# Patient Record
Sex: Male | Born: 1975 | Race: White | Hispanic: No | Marital: Married | Smoking: Never smoker
Health system: Southern US, Community
[De-identification: ages and names within clinical notes are randomized; demographics above are authoritative.]

## PROBLEM LIST (undated history)

## (undated) DIAGNOSIS — E785 Hyperlipidemia, unspecified: Secondary | ICD-10-CM

## (undated) DIAGNOSIS — B019 Varicella without complication: Secondary | ICD-10-CM

## (undated) DIAGNOSIS — Z0000 Encounter for general adult medical examination without abnormal findings: Secondary | ICD-10-CM

## (undated) DIAGNOSIS — M1990 Unspecified osteoarthritis, unspecified site: Secondary | ICD-10-CM

## (undated) DIAGNOSIS — T7840XA Allergy, unspecified, initial encounter: Secondary | ICD-10-CM

## (undated) DIAGNOSIS — R030 Elevated blood-pressure reading, without diagnosis of hypertension: Secondary | ICD-10-CM

## (undated) HISTORY — DX: Hyperlipidemia, unspecified: E78.5

## (undated) HISTORY — PX: NO PAST SURGERIES: SHX2092

## (undated) HISTORY — DX: Unspecified osteoarthritis, unspecified site: M19.90

## (undated) HISTORY — DX: Encounter for general adult medical examination without abnormal findings: Z00.00

## (undated) HISTORY — DX: Allergy, unspecified, initial encounter: T78.40XA

## (undated) HISTORY — DX: Varicella without complication: B01.9

## (undated) HISTORY — DX: Elevated blood-pressure reading, without diagnosis of hypertension: R03.0

---

## 2011-06-13 ENCOUNTER — Ambulatory Visit (INDEPENDENT_AMBULATORY_CARE_PROVIDER_SITE_OTHER): Payer: Self-pay | Admitting: Family Medicine

## 2011-06-13 ENCOUNTER — Encounter: Payer: Self-pay | Admitting: Family Medicine

## 2011-06-13 DIAGNOSIS — E785 Hyperlipidemia, unspecified: Secondary | ICD-10-CM

## 2011-06-13 DIAGNOSIS — Z0000 Encounter for general adult medical examination without abnormal findings: Secondary | ICD-10-CM

## 2011-06-13 DIAGNOSIS — R030 Elevated blood-pressure reading, without diagnosis of hypertension: Secondary | ICD-10-CM

## 2011-06-13 LAB — CBC
Hemoglobin: 14.6 g/dL (ref 13.0–17.0)
MCHC: 34.3 g/dL (ref 30.0–36.0)
Platelets: 192 10*3/uL (ref 150.0–400.0)
RBC: 4.58 Mil/uL (ref 4.22–5.81)

## 2011-06-13 LAB — RENAL FUNCTION PANEL
Albumin: 4.6 g/dL (ref 3.5–5.2)
BUN: 14 mg/dL (ref 6–23)
CO2: 28 mEq/L (ref 19–32)
Calcium: 9.5 mg/dL (ref 8.4–10.5)
Chloride: 102 mEq/L (ref 96–112)
Phosphorus: 2.1 mg/dL — ABNORMAL LOW (ref 2.3–4.6)

## 2011-06-13 LAB — HEPATIC FUNCTION PANEL
ALT: 17 U/L (ref 0–53)
AST: 17 U/L (ref 0–37)
Albumin: 4.6 g/dL (ref 3.5–5.2)
Alkaline Phosphatase: 50 U/L (ref 39–117)
Total Protein: 7.1 g/dL (ref 6.0–8.3)

## 2011-06-13 LAB — TSH: TSH: 1.24 u[IU]/mL (ref 0.35–5.50)

## 2011-06-13 NOTE — Patient Instructions (Signed)
Preventive Care for Adults, Male A healthy lifestyle and preventative care can promote health and wellness. Preventative health guidelines for men include the following key practices:  A routine yearly physical is a good way to check with your caregiver about your health and preventative screening. It is a chance to share any concerns and updates on your health, and to receive a thorough exam.   Visit your dentist for a routine exam and preventative care every 6 months. Brush your teeth twice a day and floss once a day. Good oral hygiene prevents tooth decay and gum disease.   The frequency of eye exams is based on your age, health, family medical history, use of contact lenses, and other factors. Follow your caregiver's recommendations for frequency of eye exams.   Eat a healthy diet. Foods like vegetables, fruits, whole grains, low-fat dairy products, and lean protein foods contain the nutrients you need without too many calories. Decrease your intake of foods high in solid fats, added sugars, and salt. Eat the right amount of calories for you.Get information about a proper diet from your caregiver, if necessary.   Regular physical exercise is one of the most important things you can do for your health. Most adults should get at least 150 minutes of moderate-intensity exercise (any activity that increases your heart rate and causes you to sweat) each week. In addition, most adults need muscle-strengthening exercises on 2 or more days a week.   Maintain a healthy weight. The body mass index (BMI) is a screening tool to identify possible weight problems. It provides an estimate of body fat based on height and weight. Your caregiver can help determine your BMI, and can help you achieve or maintain a healthy weight.For adults 20 years and older:   A BMI below 18.5 is considered underweight.   A BMI of 18.5 to 24.9 is normal.   A BMI of 25 to 29.9 is considered overweight.   A BMI of 30 and above  is considered obese.   Maintain normal blood lipids and cholesterol levels by exercising and minimizing your intake of saturated fat. Eat a balanced diet with plenty of fruit and vegetables. Blood tests for lipids and cholesterol should begin at age 20 and be repeated every 5 years. If your lipid or cholesterol levels are high, you are over 50, or you are a high risk for heart disease, you may need your cholesterol levels checked more frequently.Ongoing high lipid and cholesterol levels should be treated with medicines if diet and exercise are not effective.   If you smoke, find out from your caregiver how to quit. If you do not use tobacco, do not start.   If you choose to drink alcohol, do not exceed 2 drinks per day. One drink is considered to be 12 ounces (355 mL) of beer, 5 ounces (148 mL) of wine, or 1.5 ounces (44 mL) of liquor.   Avoid use of street drugs. Do not share needles with anyone. Ask for help if you need support or instructions about stopping the use of drugs.   High blood pressure causes heart disease and increases the risk of stroke. Your blood pressure should be checked at least every 1 to 2 years. Ongoing high blood pressure should be treated with medicines, if weight loss and exercise are not effective.   If you are 45 to 38 years old, ask your caregiver if you should take aspirin to prevent heart disease.   Diabetes screening involves taking a blood   sample to check your fasting blood sugar level. This should be done once every 3 years, after age 45, if you are within normal weight and without risk factors for diabetes. Testing should be considered at a younger age or be carried out more frequently if you are overweight and have at least 1 risk factor for diabetes.   Colorectal cancer can be detected and often prevented. Most routine colorectal cancer screening begins at the age of 50 and continues through age 75. However, your caregiver may recommend screening at an earlier  age if you have risk factors for colon cancer. On a yearly basis, your caregiver may provide home test kits to check for hidden blood in the stool. Use of a small camera at the end of a tube, to directly examine the colon (sigmoidoscopy or colonoscopy), can detect the earliest forms of colorectal cancer. Talk to your caregiver about this at age 50, when routine screening begins. Direct examination of the colon should be repeated every 5 to 10 years through age 75, unless early forms of pre-cancerous polyps or small growths are found.   Hepatitis C blood testing is recommended for all people born from 1945 through 1965 and any individual with known risks for hepatitis C.   Practice safe sex. Use condoms and avoid high-risk sexual practices to reduce the spread of sexually transmitted infections (STIs). STIs include gonorrhea, chlamydia, syphilis, trichomonas, herpes, HPV, and human immunodeficiency virus (HIV). Herpes, HIV, and HPV are viral illnesses that have no cure. They can result in disability, cancer, and death.   A one-time screening for abdominal aortic aneurysm (AAA) and surgical repair of large AAAs by sound wave imaging (ultrasonography) is recommended for ages 65 to 75 years who are current or former smokers.   Healthy men should no longer receive prostate-specific antigen (PSA) blood tests as part of routine cancer screening. Consult with your caregiver about prostate cancer screening.   Testicular cancer screening is not recommended for adult males who have no symptoms. Screening includes self-exam, caregiver exam, and other screening tests. Consult with your caregiver about any symptoms you have or any concerns you have about testicular cancer.   Use sunscreen with skin protection factor (SPF) of 30 or more. Apply sunscreen liberally and repeatedly throughout the day. You should seek shade when your shadow is shorter than you. Protect yourself by wearing long sleeves, pants, a  wide-brimmed hat, and sunglasses year round, whenever you are outdoors.   Once a month, do a whole body skin exam, using a mirror to look at the skin on your back. Notify your caregiver of new moles, moles that have irregular borders, moles that are larger than a pencil eraser, or moles that have changed in shape or color.   Stay current with required immunizations.   Influenza. You need a dose every fall (or winter). The composition of the flu vaccine changes each year, so being vaccinated once is not enough.   Pneumococcal polysaccharide. You need 1 to 2 doses if you smoke cigarettes or if you have certain chronic medical conditions. You need 1 dose at age 65 (or older) if you have never been vaccinated.   Tetanus, diphtheria, pertussis (Tdap, Td). Get 1 dose of Tdap vaccine if you are younger than age 65 years, are over 65 and have contact with an infant, are a healthcare worker, or simply want to be protected from whooping cough. After that, you need a Td booster dose every 10 years. Consult your caregiver if   you have not had at least 3 tetanus and diphtheria-containing shots sometime in your life or have a deep or dirty wound.   HPV. This vaccine is recommended for males 13 through 38 years of age. This vaccine may be given to men 22 through 38 years of age who have not completed the 3 dose series. It is recommended for men through age 26 who have sex with men or whose immune system is weakened because of HIV infection, other illness, or medications. The vaccine is given in 3 doses over 6 months.   Measles, mumps, rubella (MMR). You need at least 1 dose of MMR if you were born in 1957 or later. You may also need a 2nd dose.   Meningococcal. If you are age 19 to 21 years and a first-year college student living in a residence hall, or have one of several medical conditions, you need to get vaccinated against meningococcal disease. You may also need additional booster doses.   Zoster (shingles).  If you are age 60 years or older, you should get this vaccine.   Varicella (chickenpox). If you have never had chickenpox or you were vaccinated but received only 1 dose, talk to your caregiver to find out if you need this vaccine.   Hepatitis A. You need this vaccine if you have a specific risk factor for hepatitis A virus infection, or you simply wish to be protected from this disease. The vaccine is usually given as 2 doses, 6 to 18 months apart.   Hepatitis B. You need this vaccine if you have a specific risk factor for hepatitis B virus infection or you simply wish to be protected from this disease. The vaccine is given in 3 doses, usually over 6 months.  Preventative Service / Frequency Ages 19 to 39  Blood pressure check.** / Every 1 to 2 years.   Lipid and cholesterol check.** / Every 5 years beginning at age 20.   Hepatitis C blood test.** / For any individual with known risks for hepatitis C.   Skin self-exam. / Monthly.   Influenza immunization.** / Every year.   Pneumococcal polysaccharide immunization.** / 1 to 2 doses if you smoke cigarettes or if you have certain chronic medical conditions.   Tetanus, diphtheria, pertussis (Tdap,Td) immunization. / A one-time dose of Tdap vaccine. After that, you need a Td booster dose every 10 years.   HPV immunization. / 3 doses over 6 months, if 26 and younger.   Measles, mumps, rubella (MMR) immunization. / You need at least 1 dose of MMR if you were born in 1957 or later. You may also need a 2nd dose.   Meningococcal immunization. / 1 dose if you are age 19 to 21 years and a first-year college student living in a residence hall, or have one of several medical conditions, you need to get vaccinated against meningococcal disease. You may also need additional booster doses.   Varicella immunization.** / Consult your caregiver.   Hepatitis A immunization.** / Consult your caregiver. 2 doses, 6 to 18 months apart.   Hepatitis B  immunization.** / Consult your caregiver. 3 doses usually over 6 months.  Ages 40 to 64  Blood pressure check.** / Every 1 to 2 years.   Lipid and cholesterol check.** / Every 5 years beginning at age 20.   Fecal occult blood test (FOBT) of stool. / Every year beginning at age 50 and continuing until age 75. You may not have to do this test if   you get colonoscopy every 10 years.   Flexible sigmoidoscopy** or colonoscopy.** / Every 5 years for a flexible sigmoidoscopy or every 10 years for a colonoscopy beginning at age 50 and continuing until age 75.   Hepatitis C blood test.** / For all people born from 1945 through 1965 and any individual with known risks for hepatitis C.   Skin self-exam. / Monthly.   Influenza immunization.** / Every year.   Pneumococcal polysaccharide immunization.** / 1 to 2 doses if you smoke cigarettes or if you have certain chronic medical conditions.   Tetanus, diphtheria, pertussis (Tdap/Td) immunization.** / A one-time dose of Tdap vaccine. After that, you need a Td booster dose every 10 years.   Measles, mumps, rubella (MMR) immunization. / You need at least 1 dose of MMR if you were born in 1957 or later. You may also need a 2nd dose.   Varicella immunization.**/ Consult your caregiver.   Meningococcal immunization.** / Consult your caregiver.   Hepatitis A immunization.** / Consult your caregiver. 2 doses, 6 to 18 months apart.   Hepatitis B immunization.** / Consult your caregiver. 3 doses, usually over 6 months.  Ages 65 and over  Blood pressure check.** / Every 1 to 2 years.   Lipid and cholesterol check.**/ Every 5 years beginning at age 20.   Fecal occult blood test (FOBT) of stool. / Every year beginning at age 50 and continuing until age 75. You may not have to do this test if you get colonoscopy every 10 years.   Flexible sigmoidoscopy** or colonoscopy.** / Every 5 years for a flexible sigmoidoscopy or every 10 years for a colonoscopy  beginning at age 50 and continuing until age 75.   Hepatitis C blood test.** / For all people born from 1945 through 1965 and any individual with known risks for hepatitis C.   Abdominal aortic aneurysm (AAA) screening.** / A one-time screening for ages 65 to 75 years who are current or former smokers.   Skin self-exam. / Monthly.   Influenza immunization.** / Every year.   Pneumococcal polysaccharide immunization.** / 1 dose at age 65 (or older) if you have never been vaccinated.   Tetanus, diphtheria, pertussis (Tdap, Td) immunization. / A one-time dose of Tdap vaccine if you are over 65 and have contact with an infant, are a healthcare worker, or simply want to be protected from whooping cough. After that, you need a Td booster dose every 10 years.   Varicella immunization. ** / Consult your caregiver.   Meningococcal immunization.** / Consult your caregiver.   Hepatitis A immunization. ** / Consult your caregiver. 2 doses, 6 to 18 months apart.   Hepatitis B immunization.** / Check with your caregiver. 3 doses, usually over 6 months.  **Family history and personal history of risk and conditions may change your caregiver's recommendations. Document Released: 03/12/2001 Document Revised: 01/03/2011 Document Reviewed: 06/11/2010 [employer] Patient Information 2012 [employer]. 

## 2011-06-16 ENCOUNTER — Encounter: Payer: Self-pay | Admitting: Family Medicine

## 2011-06-16 DIAGNOSIS — IMO0001 Reserved for inherently not codable concepts without codable children: Secondary | ICD-10-CM

## 2011-06-16 DIAGNOSIS — E785 Hyperlipidemia, unspecified: Secondary | ICD-10-CM

## 2011-06-16 DIAGNOSIS — Z0000 Encounter for general adult medical examination without abnormal findings: Secondary | ICD-10-CM

## 2011-06-16 HISTORY — DX: Reserved for inherently not codable concepts without codable children: IMO0001

## 2011-06-16 HISTORY — DX: Hyperlipidemia, unspecified: E78.5

## 2011-06-16 HISTORY — DX: Encounter for general adult medical examination without abnormal findings: Z00.00

## 2011-06-16 NOTE — Assessment & Plan Note (Signed)
Does drink excessive amounts of caffeine, 4 cups and more a day, encouraged to minimize caffeine and sodium get exercise and eight hours sleep.

## 2011-06-16 NOTE — Assessment & Plan Note (Signed)
Encouraged exercise, heart healthy diet, adequate sleep and maintain a healthy weight

## 2011-06-16 NOTE — Assessment & Plan Note (Signed)
Mild elevation of ldl cholesterol, encouraged to avoid trans fats and start MegaRed capas daily

## 2011-06-16 NOTE — Progress Notes (Signed)
Patient ID: Jeffrey Golden. Jeffrey Golden, male   DOB: 1975/11/25, 36 y.o.   MRN: [id number] Jeffrey Golden. Reindl [id number] November 02, 1975 06/16/2011      Progress Note New Patient  Subjective  Chief Complaint  Chief Complaint  Patient presents with  . Establish Care    new patient    HPI  This is a 36 year old Caucasian male who is in today for new patient appointment. 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 He is generally in good health and is here today at the request of his wife made the appointment. He denies any recent illness, fevers, chills, chest pain, palpitations, headache, GI or GU complaints.  Past Medical History  Diagnosis Date  . Chicken pox as child  . Hyperlipidemia 06/16/2011  . Elevated BP 06/16/2011  . Preventative health care 06/16/2011    History reviewed. No pertinent past surgical history.  Family History  Problem Relation Age of Onset  . Cancer Maternal Grandmother     skin  . Parkinsonism Maternal Grandfather   . Cancer Maternal Grandfather     skin  . Cancer Paternal Grandfather     renal- remission  . Arthritis Paternal Grandmother     History   Social History  . Marital Status: Married    Spouse Name: N/A    Number of Children: N/A  . Years of Education: N/A   Occupational History  . Not on file.   Social History Main Topics  . Smoking status: Never Smoker   . Smokeless tobacco: Never Used  . Alcohol Use: Yes     1-2 beers a night 4-5 days a week  . Drug Use: No  . Sexually Active: Not on file   Other Topics Concern  . Not on file   Social History Narrative  . No narrative on file    No current outpatient prescriptions on file prior to visit.    No Known Allergies  Review of Systems  Review of Systems  Constitutional: Negative for fever, chills and malaise/fatigue.  HENT: Negative for hearing loss, nosebleeds and congestion.   Eyes: Negative for discharge.  Respiratory:  Negative for cough, sputum production, shortness of breath and wheezing.   Cardiovascular: Negative for chest pain, palpitations and leg swelling.  Gastrointestinal: Negative for heartburn, nausea, vomiting, abdominal pain, diarrhea, constipation and blood in stool.  Genitourinary: Negative for dysuria, urgency, frequency and hematuria.  Musculoskeletal: Negative for myalgias, back pain and falls.  Skin: Negative for rash.  Neurological: Negative for dizziness, tremors, sensory change, focal weakness, loss of consciousness, weakness and headaches.  Endo/Heme/Allergies: Negative for polydipsia. Does not bruise/bleed easily.  Psychiatric/Behavioral: Negative for depression and suicidal ideas. The patient is not nervous/anxious and does not have insomnia.     Objective  BP 131/92  Pulse 70  Temp(Src) 98.2 F (36.8 C) (Temporal)  Ht 5' 11.75" (1.822 m)  Wt 184 lb (83.462 kg)  BMI 25.13 kg/m2  SpO2 98%  Physical Exam  Physical Exam  Constitutional: He is oriented to person, place, and time and well-developed, well-nourished, and in no distress. No distress.  HENT:  Head: Normocephalic and atraumatic.  Eyes: Conjunctivae are normal.  Neck: Neck supple. No thyromegaly present.  Cardiovascular: Normal rate, regular rhythm and normal heart sounds.   No murmur heard. Pulmonary/Chest: Effort normal and breath sounds normal. No respiratory distress.  Abdominal: He exhibits no distension and no mass. There is no tenderness.  Musculoskeletal: He exhibits no  edema.  Neurological: He is alert and oriented to person, place, and time.  Skin: Skin is warm.  Psychiatric: Memory, affect and judgment normal.       Assessment & Plan  Elevated BP Does drink excessive amounts of caffeine, 4 cups and more a day, encouraged to minimize caffeine and sodium get exercise and eight hours sleep.   Hyperlipidemia Mild elevation of ldl cholesterol, encouraged to avoid trans fats and start MegaRed  capas daily  Preventative health care Encouraged exercise, heart healthy diet, adequate sleep and maintain a healthy weight

## 2012-03-14 ENCOUNTER — Other Ambulatory Visit: Payer: Self-pay

## 2012-12-03 ENCOUNTER — Other Ambulatory Visit: Payer: Self-pay

## 2013-10-25 ENCOUNTER — Other Ambulatory Visit (INDEPENDENT_AMBULATORY_CARE_PROVIDER_SITE_OTHER): Payer: Managed Care, Other (non HMO)

## 2013-10-25 ENCOUNTER — Encounter: Payer: Self-pay | Admitting: Family Medicine

## 2013-10-25 ENCOUNTER — Ambulatory Visit (INDEPENDENT_AMBULATORY_CARE_PROVIDER_SITE_OTHER): Payer: Managed Care, Other (non HMO) | Admitting: Family Medicine

## 2013-10-25 VITALS — BP 108/72 | HR 62 | Ht 72.0 in | Wt 165.0 lb

## 2013-10-25 DIAGNOSIS — M25519 Pain in unspecified shoulder: Secondary | ICD-10-CM

## 2013-10-25 DIAGNOSIS — M67919 Unspecified disorder of synovium and tendon, unspecified shoulder: Secondary | ICD-10-CM

## 2013-10-25 DIAGNOSIS — S46819A Strain of other muscles, fascia and tendons at shoulder and upper arm level, unspecified arm, initial encounter: Secondary | ICD-10-CM

## 2013-10-25 DIAGNOSIS — M25511 Pain in right shoulder: Secondary | ICD-10-CM

## 2013-10-25 DIAGNOSIS — S43431A Superior glenoid labrum lesion of right shoulder, initial encounter: Secondary | ICD-10-CM

## 2013-10-25 DIAGNOSIS — M7551 Bursitis of right shoulder: Secondary | ICD-10-CM

## 2013-10-25 DIAGNOSIS — M719 Bursopathy, unspecified: Secondary | ICD-10-CM

## 2013-10-25 DIAGNOSIS — S43439A Superior glenoid labrum lesion of unspecified shoulder, initial encounter: Secondary | ICD-10-CM | POA: Insufficient documentation

## 2013-10-25 DIAGNOSIS — S43499A Other sprain of unspecified shoulder joint, initial encounter: Secondary | ICD-10-CM

## 2013-10-25 MED ORDER — MELOXICAM 15 MG PO TABS: 15.0000 mg | ORAL_TABLET | Freq: Every day | ORAL | Status: DC

## 2013-10-25 NOTE — Progress Notes (Signed)
[facility] [place], [place] [address] Phone: [phone] Subjective:      CC: Right shoulder pain  [id number] Jeffrey Golden. Jeffrey Golden is a 38 y.o. male coming in with complaint of right shoulder pain. Patient states for the last month he started having increasing pain in the right shoulder. Patient does not remember any true injury but does play basketball regularly. Patient states that there has been pain when he does certain activities. Patient states reaching behind his back seems to be the most painful as well as above head. States it is just a sharp pain with certain movements and and seems to go away. Patient states that when he tries to lay on that side he has pain as well. Denies any weakness or any numbness in any radiation down the arm. Patient is a severity is 5/10. Describes it once again is a dull aching with a sharp pain from time to time.     Past medical history, social, surgical and family history all reviewed in electronic medical record.   Review of Systems: No headache, visual changes, nausea, vomiting, diarrhea, constipation, dizziness, abdominal pain, skin rash, fevers, chills, night sweats, weight loss, swollen lymph nodes, body aches, joint swelling, muscle aches, chest pain, shortness of breath, mood changes.   Objective Blood pressure 108/72, pulse 62, height 6' (1.829 m), weight 165 lb (74.844 kg), SpO2 98.00%.  General: No apparent distress alert and oriented x3 mood and affect normal, dressed appropriately.  HEENT: Pupils equal, extraocular movements intact  Respiratory: Patient's speak in full sentences and does not appear short of breath  Cardiovascular: No lower extremity edema, non tender, no erythema  Skin: Warm dry intact with no signs of infection or rash on extremities or on axial skeleton.  Abdomen: Soft nontender  Neuro: Cranial nerves II through XII are intact, neurovascularly intact in all extremities with  2+ DTRs and 2+ pulses.  Lymph: No lymphadenopathy of posterior or anterior cervical chain or axillae bilaterally.  Gait normal with good balance and coordination.  MSK:  Non tender with full range of motion and good stability and symmetric strength and tone of  elbows, wrist, hip, knee and ankles bilaterally.  Shoulder: Right Inspection reveals no abnormalities, atrophy or asymmetry. Palpation is normal with no tenderness over AC joint or bicipital groove. ROM is full in all planes passively. Rotator cuff strength normal throughout. signs of impingement with positive Neer and Hawkin's tests, but negative empty can sign. Speeds and Yergason's tests normal.  labral pathology noted with positive Obrien's, negative clunk and good stability. Normal scapular function observed. No painful arc and no drop arm sign. No apprehension sign Contralateral shoulder unremarkable  MSK US performed of: Right This study was ordered, performed, and interpreted by Charlann Boxer D.O.  Shoulder:   Supraspinatus:  Appears normal on long and transverse views, Bursal bulge seen with shoulder abduction on impingement view. Infraspinatus:  Appears normal on long and transverse views. Significant increase in Doppler flow Subscapularis:  Appears normal on long and transverse views. Positive bursa Teres Minor:  Appears normal on long and transverse views. AC joint:  Capsule undistended, no geyser sign. Glenohumeral Joint:  Appears normal without effusion. Glenoid Labrum:  Patient does have hypoechoic changes and what appears to be a tear within the posterior aspect of the labrum. Unable to see how far the day goes secondary to a depth. Biceps Tendon:  Appears normal on long and transverse views, no fraying of tendon,  tendon located in intertubercular groove, no subluxation with shoulder internal or external rotation.  Impression: Subacromial bursitis with posterior labral tear   Impression and Recommendations:      This case required medical decision making of moderate complexity.

## 2013-10-25 NOTE — Assessment & Plan Note (Signed)
Mild bursitis noted on ultrasound. We discussed icing cortical, home exercises, over-the-counter medications and was given a topical. Patient and will come back and see me again in 3-4 weeks for further evaluation.

## 2013-10-25 NOTE — Assessment & Plan Note (Signed)
Patient does have what appears to be a labral tear on ultrasound and on physical exam this is consistent. Patient to is able to do all activities of daily living. We discussed which activities to avoid. Patient is going to try oral as well as topical anti-inflammatories, icing protocol, as well as home exercises. Patient will try this and come back in 2-3 weeks. Patient does not make any significant improvement we'll either consider nitroglycerin patches versus potentially intra-articular injection.

## 2013-10-25 NOTE — Patient Instructions (Addendum)
Good to see you Ice 20 minutes 2 times daily. Usually after activity and before bed. Meloxicam daily for 10 days then as needed Pennsaid twice daily if you like it I will get you a prescription.  Exercises 3 times a week.  Come back in 2-3 weeks and if not better I get to  Stick you.  Turmeric 500mg  twice daily Vitamin D 2000 Iu dialy.

## 2015-07-20 ENCOUNTER — Ambulatory Visit (INDEPENDENT_AMBULATORY_CARE_PROVIDER_SITE_OTHER): Payer: Managed Care, Other (non HMO)

## 2015-07-20 ENCOUNTER — Ambulatory Visit (INDEPENDENT_AMBULATORY_CARE_PROVIDER_SITE_OTHER): Payer: Managed Care, Other (non HMO) | Admitting: Family Medicine

## 2015-07-20 VITALS — BP 110/72 | HR 68 | Temp 98.0°F | Resp 18 | Ht 72.5 in | Wt 166.0 lb

## 2015-07-20 DIAGNOSIS — S92302A Fracture of unspecified metatarsal bone(s), left foot, initial encounter for closed fracture: Secondary | ICD-10-CM

## 2015-07-20 DIAGNOSIS — S99922A Unspecified injury of left foot, initial encounter: Secondary | ICD-10-CM

## 2015-07-20 NOTE — Patient Instructions (Addendum)
Use crutches, do not bear weight  Wear short cam walker while awake. Remove several times a day and check circulation.    IF you received an x-ray today, you will receive an invoice from [facility]. Please contact [facility] at [phone] with questions or concerns regarding your invoice.   IF you received labwork today, you will receive an invoice from [employer]. Please contact [facility] at [phone] with questions or concerns regarding your invoice.   Our billing staff will not be able to assist you with questions regarding bills from these companies.  You will be contacted with the lab results as soon as they are available. The fastest way to get your results is to activate your My Chart account. Instructions are located on the last page of this paperwork. If you have not heard from [place] regarding the results in 2 weeks, please contact this office.    Metatarsal Fracture A metatarsal fracture is a break in a metatarsal bone. Metatarsal bones connect your toe bones to your ankle bones. CAUSES This type of fracture may be caused by:  A sudden twisting of your foot.  A fall onto your foot.  Overuse or repetitive exercise. RISK FACTORS This condition is more likely to develop in people who:  Play contact sports.  Have a bone disease.  Have a low calcium level. SYMPTOMS Symptoms of this condition include:  Pain that is worse when walking or standing.  Pain when pressing on the foot or moving the toes.  Swelling.  Bruising on the top or bottom of the foot.  A foot that appears shorter than the other one. DIAGNOSIS This condition is diagnosed with a physical exam. You may also have imaging tests, such as:  X-rays.  A CT scan.  MRI. TREATMENT Treatment for this condition depends on its severity and whether a bone has moved out of place. Treatment may involve:  Rest.  Wearing foot support such as a cast, splint, or  boot for several weeks.  Using crutches.  Surgery to move bones back into the right position. Surgery is usually needed if there are many pieces of broken bone or bones that are very out of place (displaced fracture).  Physical therapy. This may be needed to help you regain full movement and strength in your foot. You will need to return to your health care provider to have X-rays taken until your bones heal. Your health care provider will look at the X-rays to make sure that your foot is healing well. HOME CARE INSTRUCTIONS  If You Have a Cast:  Do not stick anything inside the cast to scratch your skin. Doing that increases your risk of infection.  Check the skin around the cast every day. Report any concerns to your health care provider. You may put lotion on dry skin around the edges of the cast. Do not apply lotion to the skin underneath the cast.  Keep the cast clean and dry. If You Have a Splint or a Supportive Boot:  Wear it as directed by your health care provider. Remove it only as directed by your health care provider.  Loosen it if your toes become numb and tingle, or if they turn cold and blue.  Keep it clean and dry. Bathing  Do not take baths, swim, or use a hot tub until your health care provider approves. Ask your health care provider if you can take showers. You may only be allowed to take sponge baths for bathing.  If your health care provider approves bathing and showering, cover the cast or splint with a watertight plastic bag to protect it from water. Do not let the cast or splint get wet. Managing Pain, Stiffness, and Swelling  If directed, apply ice to the injured area (if you have a splint, not a cast).  Put ice in a plastic bag.  Place a towel between your skin and the bag.  Leave the ice on for 20 minutes, 2-3 times per day.  Move your toes often to avoid stiffness and to lessen swelling.  Raise (elevate) the injured area above the level of your  heart while you are sitting or lying down. Driving  Do not drive or operate heavy machinery while taking pain medicine.  Do not drive while wearing foot support on a foot that you use for driving. Activity  Return to your normal activities as directed by your health care provider. Ask your health care provider what activities are safe for you.  Perform exercises as directed by your health care provider or physical therapist. Safety  Do not use the injured foot to support your body weight until your health care provider says that you can. Use crutches as directed by your health care provider. General Instructions  Do not put pressure on any part of the cast or splint until it is fully hardened. This may take several hours.  Do not use any tobacco products, including cigarettes, chewing tobacco, or e-cigarettes. Tobacco can delay bone healing. If you need help quitting, ask your health care provider.  Take medicines only as directed by your health care provider.  Keep all follow-up visits as directed by your health care provider. This is important. SEEK MEDICAL CARE IF:  You have a fever.  Your cast, splint, or boot is too loose or too tight.  Your cast, splint, or boot is damaged.  Your pain medicine is not helping.  You have pain, tingling, or numbness in your foot that is not going away. SEEK IMMEDIATE MEDICAL CARE IF:  You have severe pain.  You have tingling or numbness in your foot that is getting worse.  Your foot feels cold or becomes numb.  Your foot changes color.   This information is not intended to replace advice given to you by your health care provider. Make sure you discuss any questions you have with your health care provider.   Document Released: 10/06/2001 Document Revised: 05/31/2014 Document Reviewed: 11/10/2013 Elsevier Interactive Patient Education [employer].

## 2015-07-20 NOTE — Progress Notes (Signed)
   Subjective:    Patient ID: Jeffrey Golden. [name], male    DOB: 02-07-1975, 40 y.o.   MRN: [id number]  HPI This is a 40 yo male who presents today with left foot pain and swelling following rolling it while playing basketball. He applied ice and elevated it last night. Took 2 alleve last night and this morning.  Past Medical History  Diagnosis Date  . Chicken pox as child  . Hyperlipidemia 06/16/2011  . Elevated BP 06/16/2011  . Preventative health care 06/16/2011   History reviewed. No pertinent past surgical history. Family History  Problem Relation Age of Onset  . Cancer Maternal Grandmother     skin  . Parkinsonism Maternal Grandfather   . Cancer Maternal Grandfather     skin  . Cancer Paternal Grandfather     renal- remission  . Arthritis Paternal Grandmother    Social History  Substance Use Topics  . Smoking status: Never Smoker   . Smokeless tobacco: Never Used  . Alcohol Use: Yes     Comment: 1-2 beers a night 4-5 days a week      Review of Systems Per HPI     Objective:   Physical Exam  Constitutional: He is oriented to person, place, and time. He appears well-developed and well-nourished.  HENT:  Head: Normocephalic and atraumatic.  Eyes: Conjunctivae are normal.  Neck: Normal range of motion. Neck supple.  Cardiovascular: Normal rate.   Pulmonary/Chest: Effort normal.  Musculoskeletal:       Left foot: There is decreased range of motion, tenderness and swelling.       Feet:  Neurological: He is alert and oriented to person, place, and time.  Skin: Skin is warm and dry.  Psychiatric: He has a normal mood and affect. His behavior is normal. Judgment and thought content normal.  Vitals reviewed.     BP 110/72 mmHg  Pulse 68  Temp(Src) 98 F (36.7 C) (Oral)  Resp 18  Ht 6' 0.5" (1.842 m)  Wt 166 lb (75.297 kg)  BMI 22.19 kg/m2  SpO2 97% Wt Readings from Last 3 Encounters:  07/20/15 166 lb (75.297 kg)  10/25/13 165 lb (74.844 kg)  06/13/11 184  lb (83.462 kg)  Dg Foot Complete Left  07/20/2015  CLINICAL DATA:  Left foot injury playing basketball last night. EXAM: LEFT FOOT - COMPLETE 3+ VIEW COMPARISON:  None. FINDINGS: There is a displaced oblique fracture of the mid shaft of the fifth metatarsal. Remainder of the exam is within normal. IMPRESSION: Displaced oblique fracture of the midshaft of the fifth metatarsal. Electronically Signed   By: Marin Olp M.D.   On: 07/20/2015 17:50         Assessment & Plan:  1. Foot injury, left, initial encounter - DG Foot Complete Left; Future  2. Fracture of fifth metatarsal bone, left, closed, initial encounter - Patient advised of xray findings, placed in tall cam walker (no short cam walkers available in his size), instructed on use of crutches and to be non weight bearing until ortho evaluation - AMB referral to orthopedics   Clarene Reamer, FNP-BC  [facility]  07/24/2015 11:28 AM

## 2018-02-05 IMAGING — DX DG FOOT COMPLETE 3+V*L*
3 series · 3 of 3 positions shown · non-contrast
Comparison: None.

CLINICAL DATA: Left foot injury playing basketball last night.

EXAM:
LEFT FOOT - COMPLETE 3+ VIEW

[foot ap]
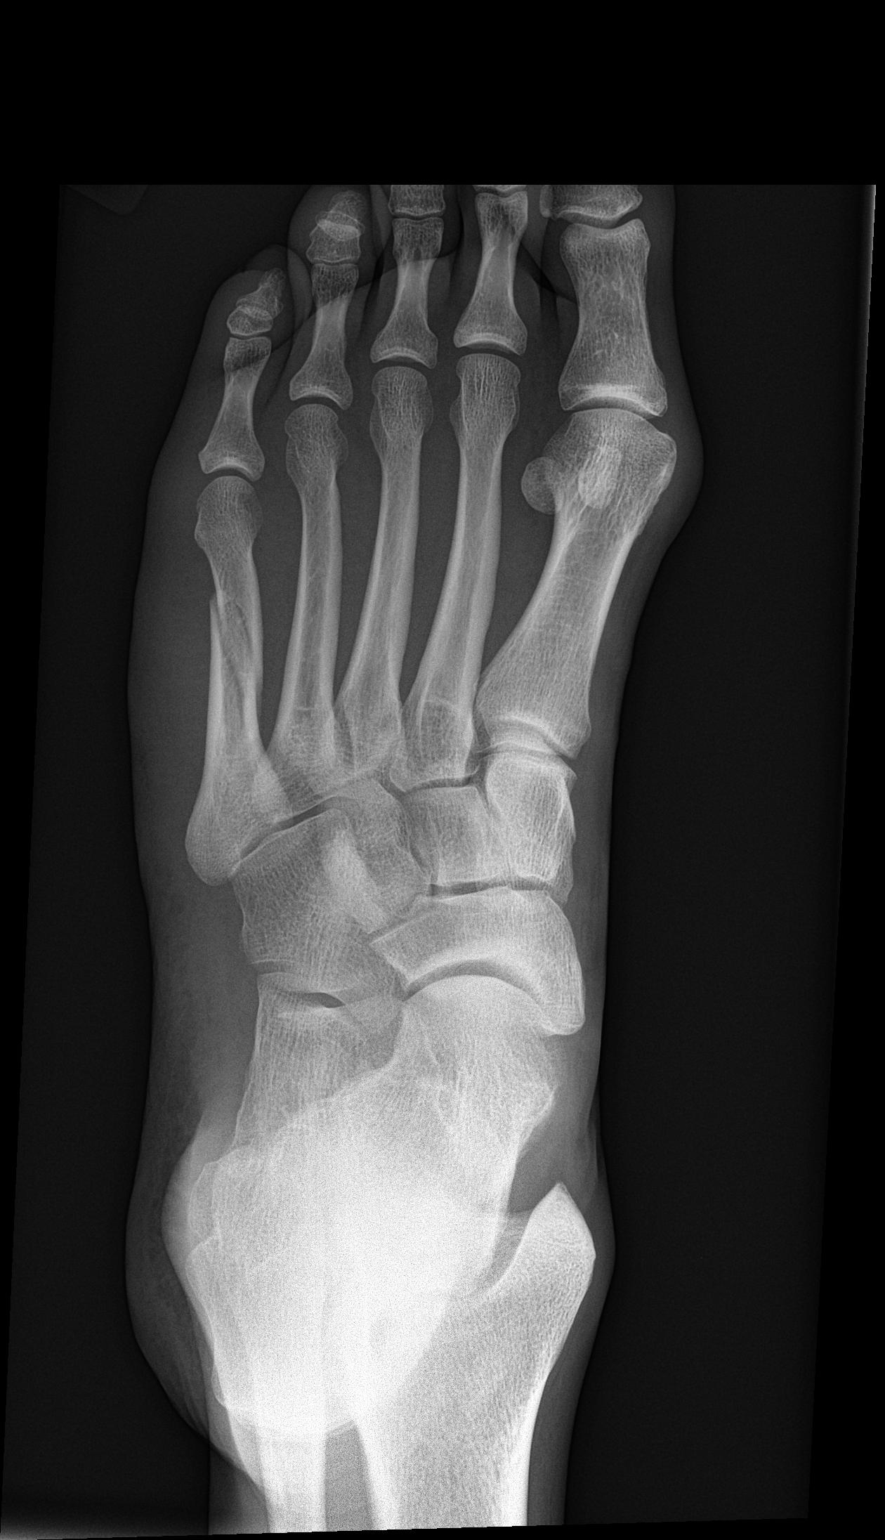

[foot obl]
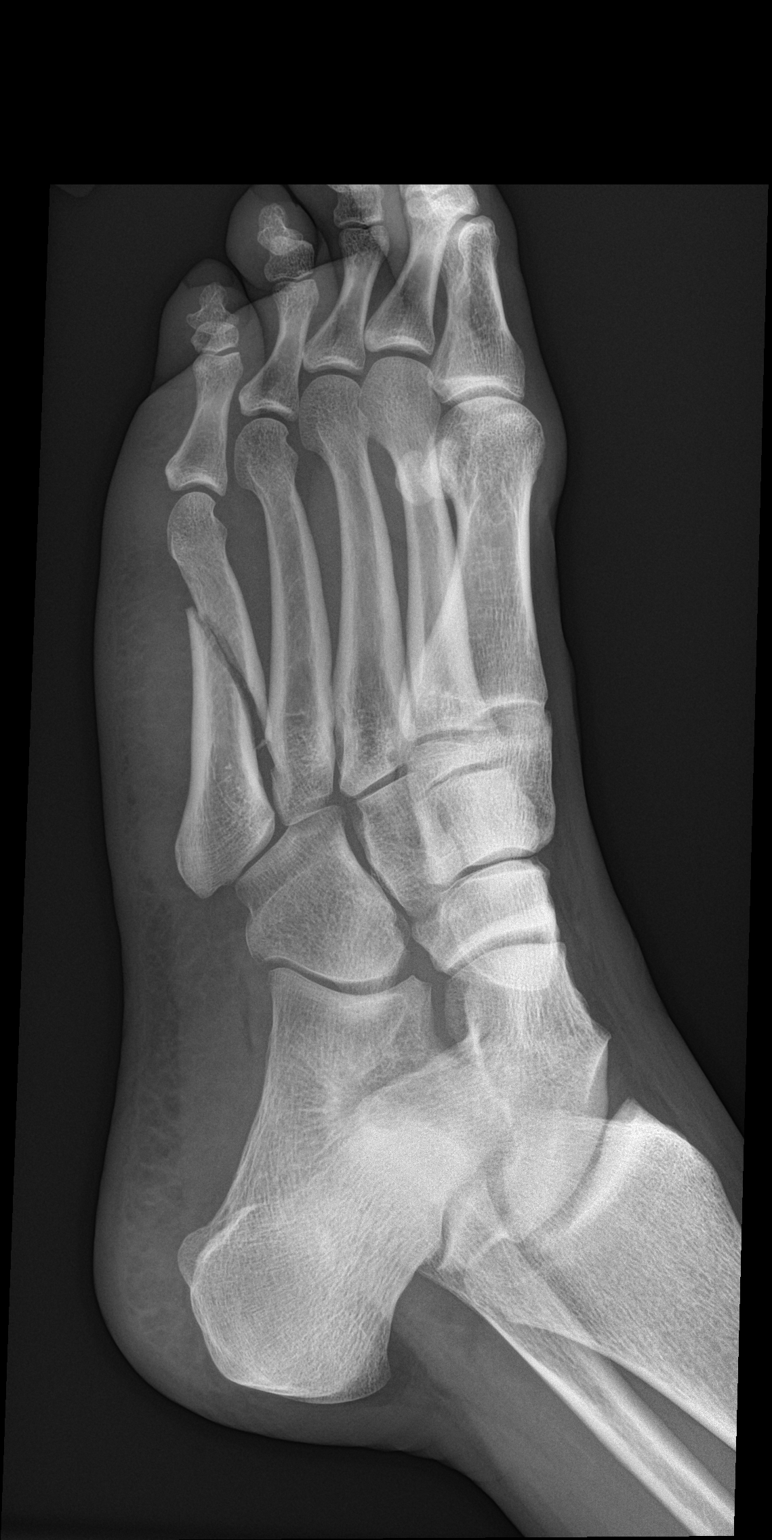

[foot lat]
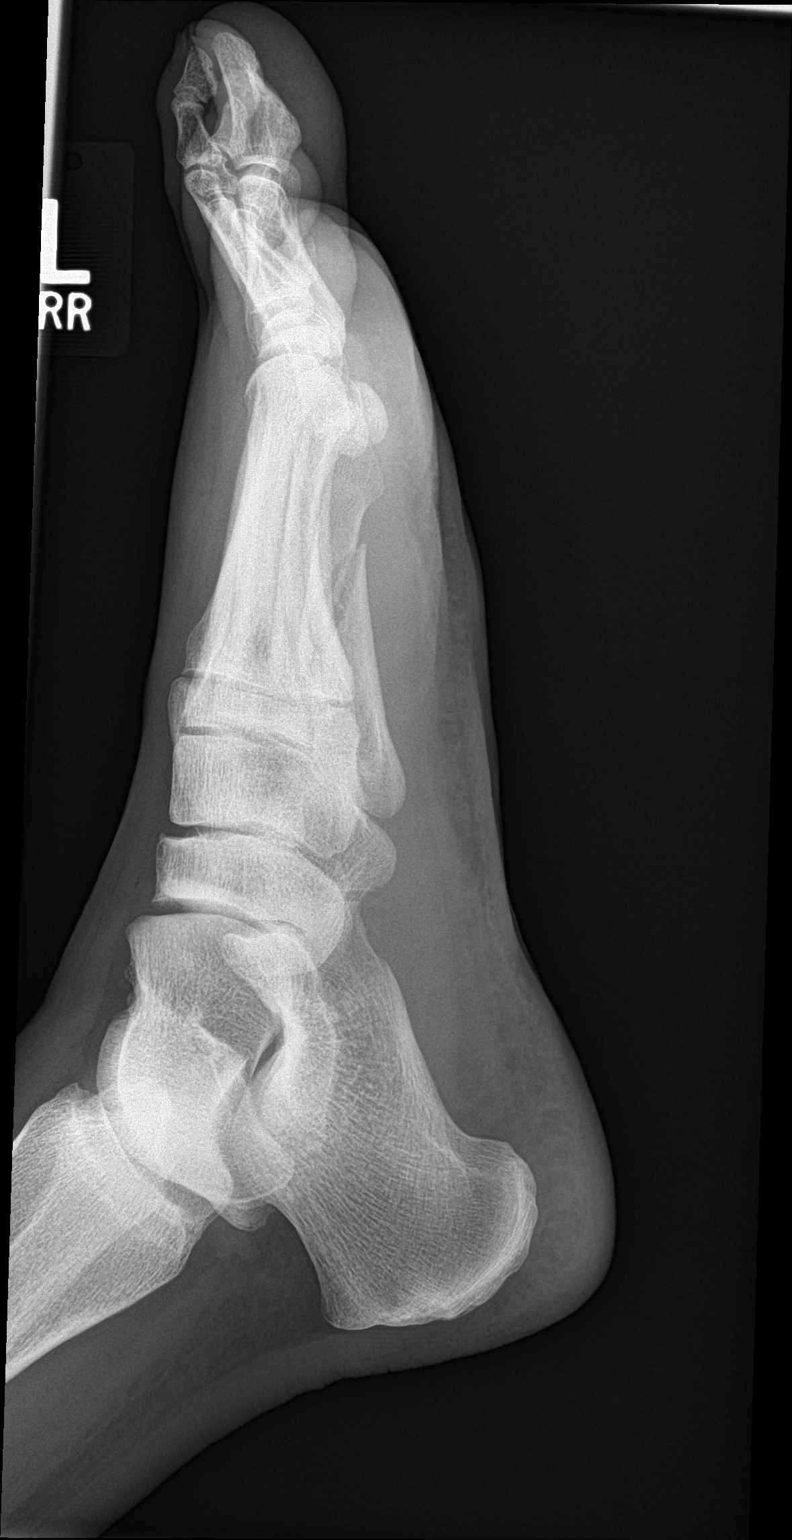

[3 of 3 positions shown; findings below may reference images not displayed]

FINDINGS: There is a displaced oblique fracture of the mid shaft of the fifth
metatarsal. Remainder of the exam is within normal.
IMPRESSION: Displaced oblique fracture of the midshaft of the fifth metatarsal.

## 2020-04-04 ENCOUNTER — Encounter: Payer: Self-pay | Admitting: Gastroenterology

## 2020-06-02 ENCOUNTER — Other Ambulatory Visit: Payer: Self-pay

## 2020-06-02 ENCOUNTER — Ambulatory Visit (AMBULATORY_SURGERY_CENTER): Payer: 59 | Admitting: *Deleted

## 2020-06-02 VITALS — Ht 72.5 in | Wt 175.0 lb

## 2020-06-02 DIAGNOSIS — Z1211 Encounter for screening for malignant neoplasm of colon: Secondary | ICD-10-CM

## 2020-06-02 MED ORDER — PEG 3350-KCL-NA BICARB-NACL 420 G PO SOLR: 4000.0000 mL | Freq: Once | ORAL | 0 refills | Status: AC

## 2020-06-02 NOTE — Progress Notes (Signed)

## 2020-06-13 ENCOUNTER — Encounter: Payer: Self-pay | Admitting: Gastroenterology

## 2020-06-16 ENCOUNTER — Other Ambulatory Visit: Payer: Self-pay

## 2020-06-16 ENCOUNTER — Encounter: Payer: Self-pay | Admitting: Gastroenterology

## 2020-06-16 ENCOUNTER — Ambulatory Visit (AMBULATORY_SURGERY_CENTER): Payer: 59 | Admitting: Gastroenterology

## 2020-06-16 VITALS — BP 100/68 | HR 56 | Temp 98.0°F | Resp 14 | Ht 72.5 in | Wt 175.0 lb

## 2020-06-16 DIAGNOSIS — D125 Benign neoplasm of sigmoid colon: Secondary | ICD-10-CM

## 2020-06-16 DIAGNOSIS — Z1211 Encounter for screening for malignant neoplasm of colon: Secondary | ICD-10-CM

## 2020-06-16 MED ORDER — SODIUM CHLORIDE 0.9 % IV SOLN: 500.0000 mL | Freq: Once | INTRAVENOUS | Status: DC

## 2020-06-16 NOTE — Progress Notes (Signed)
Pt's states no medical or surgical changes since previsit or office visit. 

## 2020-06-16 NOTE — Progress Notes (Signed)
No problems noted in the recovery room. maw 

## 2020-06-16 NOTE — Progress Notes (Signed)
Called to room to assist during endoscopic procedure.  Patient ID and intended procedure confirmed with present staff. Received instructions for my participation in the procedure from the performing physician.  

## 2020-06-16 NOTE — Progress Notes (Signed)
Report to PACU, RN, vss, BBS= Clear.  

## 2020-06-16 NOTE — Patient Instructions (Addendum)
Handouts were given to your care partner on polyps and hemorrhoids. You may resume your current medications today. Await biopsy results.  May take 1-3 weeks to receive pathology results. Please call if any questions or concerns.     YOU HAD AN ENDOSCOPIC PROCEDURE TODAY AT THE [facility]:   Refer to the procedure report that was given to you for any specific questions about what was found during the examination.  If the procedure report does not answer your questions, please call your gastroenterologist to clarify.  If you requested that your care partner not be given the details of your procedure findings, then the procedure report has been included in a sealed envelope for you to review at your convenience later.  YOU SHOULD EXPECT: Some feelings of bloating in the abdomen. Passage of more gas than usual.  Walking can help get rid of the air that was put into your GI tract during the procedure and reduce the bloating. If you had a lower endoscopy (such as a colonoscopy or flexible sigmoidoscopy) you may notice spotting of blood in your stool or on the toilet paper. If you underwent a bowel prep for your procedure, you may not have a normal bowel movement for a few days.  Please Note:  You might notice some irritation and congestion in your nose or some drainage.  This is from the oxygen used during your procedure.  There is no need for concern and it should clear up in a day or so.  SYMPTOMS TO REPORT IMMEDIATELY:   Following lower endoscopy (colonoscopy or flexible sigmoidoscopy):  Excessive amounts of blood in the stool  Significant tenderness or worsening of abdominal pains  Swelling of the abdomen that is new, acute  Fever of 100F or higher   For urgent or emergent issues, a gastroenterologist can be reached at any hour by calling [phone]. Do not use MyChart messaging for urgent concerns.    DIET:  We do recommend a small meal at first, but then you may proceed  to your regular diet.  Drink plenty of fluids but you should avoid alcoholic beverages for 24 hours.  ACTIVITY:  You should plan to take it easy for the rest of today and you should NOT DRIVE or use heavy machinery until tomorrow (because of the sedation medicines used during the test).    FOLLOW UP: Our staff will call the number listed on your records 48-72 hours following your procedure to check on you and address any questions or concerns that you may have regarding the information given to you following your procedure. If we do not reach you, we will leave a message.  We will attempt to reach you two times.  During this call, we will ask if you have developed any symptoms of COVID 19. If you develop any symptoms (ie: fever, flu-like symptoms, shortness of breath, cough etc.) before then, please call [phone].  If you test positive for Covid 19 in the 2 weeks post procedure, please call and report this information to us.    If any biopsies were taken you will be contacted by phone or by letter within the next 1-3 weeks.  Please call us at [phone] if you have not heard about the biopsies in 3 weeks.    SIGNATURES/CONFIDENTIALITY: You and/or your care partner have signed paperwork which will be entered into your electronic medical record.  These signatures attest to the fact that that the information above on your After Visit   Visit Summary has been reviewed and is understood.  Full responsibility of the confidentiality of this discharge information lies with you and/or your care-partner.

## 2020-06-16 NOTE — Op Note (Signed)
[facility] Patient Name: Jeffrey Golden Procedure Date: 06/16/2020 9:02 AM MRN: [id number] Endoscopist: Ladene Artist , MD Age: 45 Referring MD:  Date of Birth: 1975/10/04 Gender: Male Account #: [id number] Procedure:                Colonoscopy Indications:              Screening for colorectal malignant neoplasm Medicines:                Monitored Anesthesia Care Procedure:                Pre-Anesthesia Assessment:                           - Prior to the procedure, a History and Physical                            was performed, and patient medications and                            allergies were reviewed. The patient's tolerance of                            previous anesthesia was also reviewed. The risks                            and benefits of the procedure and the sedation                            options and risks were discussed with the patient.                            All questions were answered, and informed consent                            was obtained. Prior Anticoagulants: The patient has                            taken no previous anticoagulant or antiplatelet                            agents. ASA Grade Assessment: I - A normal, healthy                            patient. After reviewing the risks and benefits,                            the patient was deemed in satisfactory condition to                            undergo the procedure.                           After obtaining informed consent, the colonoscope  was passed under direct vision. Throughout the                            procedure, the patient's blood pressure, pulse, and                            oxygen saturations were monitored continuously. The                            Olympus CF-HQ190L (Serial# [id number]) Colonoscope was                            introduced through the anus and advanced to the the                            cecum, identified by  appendiceal orifice and                            ileocecal valve. The ileocecal valve, appendiceal                            orifice, and rectum were photographed. The quality                            of the bowel preparation was excellent. The                            colonoscopy was performed without difficulty. The                            patient tolerated the procedure well. Scope In: 9:09:07 AM Scope Out: 9:27:37 AM Scope Withdrawal Time: 0 hours 12 minutes 37 seconds  Total Procedure Duration: 0 hours 18 minutes 30 seconds  Findings:                 The perianal and digital rectal examinations were                            normal.                           A 7 mm polyp was found in the sigmoid colon. The                            polyp was sessile. The polyp was removed with a                            cold snare. Resection and retrieval were complete.                           Internal hemorrhoids were found during                            retroflexion. The hemorrhoids were small and Grade  I (internal hemorrhoids that do not prolapse).                           The exam was otherwise without abnormality on                            direct and retroflexion views. Complications:            No immediate complications. Estimated blood loss:                            None. Estimated Blood Loss:     Estimated blood loss: none. Impression:               - One 7 mm polyp in the sigmoid colon, removed with                            a cold snare. Resected and retrieved.                           - Internal hemorrhoids.                           - The examination was otherwise normal on direct                            and retroflexion views. Recommendation:           - Repeat colonoscopy after studies are complete for                            surveillance based on pathology results.                           - Patient has a contact number  available for                            emergencies. The signs and symptoms of potential                            delayed complications were discussed with the                            patient. Return to normal activities tomorrow.                            Written discharge instructions were provided to the                            patient.                           - Resume previous diet.                           - Continue present medications.                           -  Await pathology results. Ladene Artist, MD 06/16/2020 9:34:12 AM This report has been signed electronically.

## 2020-06-20 ENCOUNTER — Telehealth: Payer: Self-pay

## 2020-06-20 NOTE — Telephone Encounter (Signed)
  Follow up Call-  Call back number 06/16/2020  Post procedure Call Back phone  # [phone]  Permission to leave phone message Yes  Some recent data might be hidden     Patient questions:  Do you have a fever, pain , or abdominal swelling? No. Pain Score  0 *  Have you tolerated food without any problems? Yes.    Have you been able to return to your normal activities? Yes.    Do you have any questions about your discharge instructions: Diet   No. Medications  No. Follow up visit  No.  Do you have questions or concerns about your Care? No.  Actions: * If pain score is 4 or above: No action needed, pain <4.  1. Have you developed a fever since your procedure? no  2.   Have you had an respiratory symptoms (SOB or cough) since your procedure? no  3.   Have you tested positive for COVID 19 since your procedure no  4.   Have you had any family members/close contacts diagnosed with the COVID 19 since your procedure?  no   If yes to any of these questions please route to Joylene John, RN and Joella Prince, RN

## 2020-07-06 ENCOUNTER — Encounter: Payer: Self-pay | Admitting: Gastroenterology

## 2023-04-23 ENCOUNTER — Other Ambulatory Visit (HOSPITAL_BASED_OUTPATIENT_CLINIC_OR_DEPARTMENT_OTHER): Payer: Self-pay | Admitting: Student

## 2023-04-23 ENCOUNTER — Other Ambulatory Visit (HOSPITAL_BASED_OUTPATIENT_CLINIC_OR_DEPARTMENT_OTHER): Payer: Self-pay | Admitting: Family Medicine

## 2023-04-23 ENCOUNTER — Telehealth (HOSPITAL_BASED_OUTPATIENT_CLINIC_OR_DEPARTMENT_OTHER): Payer: Self-pay

## 2023-04-23 DIAGNOSIS — E785 Hyperlipidemia, unspecified: Secondary | ICD-10-CM

## 2023-05-12 ENCOUNTER — Ambulatory Visit (HOSPITAL_BASED_OUTPATIENT_CLINIC_OR_DEPARTMENT_OTHER)
Admission: RE | Admit: 2023-05-12 | Discharge: 2023-05-12 | Disposition: A | Discharge status: Home / Self Care | Payer: Self-pay | Source: Ambulatory Visit | Attending: Family Medicine | Admitting: Family Medicine

## 2023-05-12 DIAGNOSIS — E785 Hyperlipidemia, unspecified: Secondary | ICD-10-CM | POA: Insufficient documentation

## 2023-05-16 ENCOUNTER — Other Ambulatory Visit (HOSPITAL_COMMUNITY): Payer: Self-pay | Admitting: Family Medicine

## 2023-05-16 DIAGNOSIS — E785 Hyperlipidemia, unspecified: Secondary | ICD-10-CM

## 2023-05-21 ENCOUNTER — Ambulatory Visit: Admitting: Physician Assistant

## 2023-05-21 ENCOUNTER — Encounter: Payer: Self-pay | Admitting: Physician Assistant

## 2023-05-21 VITALS — BP 110/80 | HR 56 | Ht 71.0 in | Wt 171.1 lb

## 2023-05-21 DIAGNOSIS — K648 Other hemorrhoids: Secondary | ICD-10-CM | POA: Diagnosis not present

## 2023-05-21 DIAGNOSIS — E785 Hyperlipidemia, unspecified: Secondary | ICD-10-CM

## 2023-05-21 DIAGNOSIS — Z860101 Personal history of adenomatous and serrated colon polyps: Secondary | ICD-10-CM

## 2023-05-21 NOTE — Progress Notes (Signed)
 05/21/2023 Jeffrey Golden. [place] [id number] 17-Feb-1975  Referring provider: No ref. provider found Primary GI doctor: Dr. Venice Gillis ( Dr. Sandrea Cruel)  ASSESSMENT AND PLAN:   Colon polyp, wife concerned about colon cancer, here to discuss further 2022 7 mm precancerous polyp removed from sigmoid colon.  Family history of colon cancer in maternal grandmother. Screening interval set at 7 years based on polyp size and family history. Asymptomatic.  - Gather information on family history, specifically age of colon cancer diagnosis in maternal grandmother and details of mother's colonoscopy history. - Maintain current screening interval of 7 years unless new symptoms arise or family history indicates increased risk. - Consider lifestyle modifications: increase fruits and vegetables, decrease red meat, alcohol, and smoking.  Internal hemorrhoids Small grade 1 internal hemorrhoids identified. Asymptomatic.  Follow-up Primary care provider: Geraldyne Kluver, PA, in [place]. - Follow up with primary care provider for ongoing health maintenance and necessary lab work.    Patient Care Team: Pcp, No as PCP - General  HISTORY OF PRESENT ILLNESS: 48 y.o. male with a past medical history listed below presents for discussion of colon cancer screening.   Discussed the use of AI scribe software for clinical note transcription with the patient, who gave verbal consent to proceed.  History of Present Illness   Jeffrey Beamer. Golden is a 48 year old male who presents for follow-up regarding colonoscopy findings and family history of colon cancer.  He underwent a colonoscopy on Jun 16, 2020, which revealed a 7 mm precancerous polyp in the sigmoid colon and small grade one internal hemorrhoids. The polyp was removed. He is currently asymptomatic with no rectal bleeding, changes in bowel habits, or abdominal pain.  He has a family history of colon cancer, specifically his maternal grandmother who had  significant colon cancer and lived into her early nineties. He is unsure of the age at which she was diagnosed and is gathering more information about her age at diagnosis and his mother's colonoscopy history.  He has regular bowel movements every morning without straining or blood in the stool. No upper gastrointestinal symptoms such as trouble swallowing or heartburn.  Recent lab tests through his primary care provider in [place] showed mostly normal results, except for a slightly elevated LDL and lipoprotein A.      He  reports that he has never smoked. He has never used smokeless tobacco. He reports current alcohol use. He reports that he does not use drugs.  RELEVANT GI HISTORY, IMAGING AND LABS: Results   LABS CBC: normal LDL: slightly elevated Lipoprotein A: slightly elevated  DIAGNOSTIC Colonoscopy: 7 mm polyp in sigmoid colon, internal hemorrhoids, small grade one (06/16/2020)      CBC    Component Value Date/Time   WBC 4.6 06/13/2011 1013   RBC 4.58 06/13/2011 1013   HGB 14.6 06/13/2011 1013   HCT 42.7 06/13/2011 1013   PLT 192.0 06/13/2011 1013   MCV 93.3 06/13/2011 1013   MCHC 34.3 06/13/2011 1013   RDW 12.0 06/13/2011 1013   No results for input(s): "HGB" in the last 8760 hours.  CMP     Component Value Date/Time   NA 137 06/13/2011 1013   K 4.6 06/13/2011 1013   CL 102 06/13/2011 1013   CO2 28 06/13/2011 1013   GLUCOSE 96 06/13/2011 1013   BUN 14 06/13/2011 1013   CREATININE 1.0 06/13/2011 1013   CALCIUM 9.5 06/13/2011 1013   PROT 7.1 06/13/2011 1013   ALBUMIN 4.6 06/13/2011 1013  ALBUMIN 4.6 06/13/2011 1013   AST 17 06/13/2011 1013   ALT 17 06/13/2011 1013   ALKPHOS 50 06/13/2011 1013   BILITOT 0.8 06/13/2011 1013      Latest Ref Rng & Units 06/13/2011   10:13 AM  Hepatic Function  Total Protein 6.0 - 8.3 g/dL 7.1   Albumin 3.5 - 5.2 g/dL 3.5 - 5.2 g/dL 4.6    4.6   AST 0 - 37 U/L 17   ALT 0 - 53 U/L 17   Alk Phosphatase 39 - 117 U/L  50   Total Bilirubin 0.3 - 1.2 mg/dL 0.8   Bilirubin, Direct 0.0 - 0.3 mg/dL 0.0       Current Medications:     Current Outpatient Medications (Respiratory):    cetirizine (ZYRTEC) 10 MG tablet, Take 10 mg by mouth daily.   pseudoephedrine (SUDAFED) 30 MG tablet, Take 30 mg by mouth as needed. Reported on 07/20/2015  Current Outpatient Medications (Analgesics):    ibuprofen (ADVIL) 200 MG tablet, Take 200 mg by mouth every 6 (six) hours as needed.   naproxen sodium (ALEVE) 220 MG tablet, Take 220 mg by mouth.   Current Outpatient Medications (Other):    ASHWAGANDHA PO, Take by mouth.   B Complex Vitamins (B COMPLEX PO), Take by mouth.   Multiple Vitamin (MULTIVITAMIN) tablet, Take 1 tablet by mouth daily.   VITAMIN D PO, Take by mouth.   zinc gluconate 50 MG tablet, Take 50 mg by mouth daily.  Medical History:  Past Medical History:  Diagnosis Date   Allergy    MILD   Arthritis    HANDS- MILD    Chicken pox as child   Elevated BP 06/16/2011   Hyperlipidemia 06/16/2011   Preventative health care 06/16/2011   Allergies: No Known Allergies   Surgical History:  He  has a past surgical history that includes No past surgeries. Family History:  His family history includes Arthritis in his paternal grandmother; Cancer in his maternal grandfather, maternal grandmother, and paternal grandfather; Colon cancer in his maternal grandmother; Parkinsonism in his maternal grandfather.  REVIEW OF SYSTEMS  : All other systems reviewed and negative except where noted in the History of Present Illness.  PHYSICAL EXAM: BP 110/80 (BP Location: Left Arm, Patient Position: Sitting, Cuff Size: Normal)   Pulse (!) 56   Ht 5\' 11"  (1.803 m) Comment: height measured without shoes  Wt 171 lb 2 oz (77.6 kg)   BMI 23.87 kg/m  General Appearance: Well nourished, in no apparent distress. Respiratory: Respiratory effort normal, BS equal bilaterally without rales, rhonchi, wheezing. Cardio: RRR with  no MRGs. Abdomen: Soft,  Flat ,active bowel sounds. No tenderness . Without guarding and Without rebound. No masses. Rectal: Not evaluated Musculoskeletal: Full ROM, Normal gait Neuro: Alert and  oriented x4;  No focal deficits. Psych:  Cooperative. Normal mood and affect.    Edmonia Gottron, PA-C 3:29 PM

## 2023-05-21 NOTE — Patient Instructions (Signed)
 VISIT SUMMARY:  Today, you had a follow-up appointment to discuss your recent colonoscopy findings and family history of colon cancer. Your colonoscopy revealed a 7 mm precancerous polyp, which was removed, and small grade one internal hemorrhoids. You are currently asymptomatic. We also reviewed your recent lab results, which showed slightly elevated LDL cholesterol and lipoprotein(a).  YOUR PLAN:  -COLON POLYP: A 7 mm precancerous polyp was found and removed from your sigmoid colon. Precancerous polyps are growths that could potentially turn into cancer if left untreated. Given your family history of colon cancer, we recommend gathering more information about your maternal grandmother's age at diagnosis and your mother's colonoscopy history. Your next colonoscopy is scheduled for 7 years from now, unless new symptoms arise or your family history indicates a higher risk. Additionally, consider increasing your intake of fruits and vegetables and reducing red meat, alcohol, and smoking.  -INTERNAL HEMORRHOIDS: Small grade 1 internal hemorrhoids were identified during your colonoscopy. Hemorrhoids are swollen veins in the lower part of your rectum and anus. They are currently not causing any symptoms, so no treatment is needed at this time.  -ELEVATED LDL CHOLESTEROL: Your recent lab results showed slightly elevated LDL cholesterol. LDL cholesterol is often referred to as 'bad' cholesterol because high levels can lead to plaque buildup in your arteries and result in heart disease. We will continue to monitor this and may discuss lifestyle changes or medications if necessary.  -ELEVATED LIPOPROTEIN(A): Your lab results also indicated slightly elevated lipoprotein(a), a type of lipoprotein associated with an increased risk of cardiovascular diseases. Since your previous cardiac calcium score test was inconclusive, we will proceed with another cardiac calcium score test next week to better assess your heart  health.  INSTRUCTIONS:  Please follow up with your primary care provider, Geraldyne Kluver, PA, in [place] for ongoing health maintenance and any necessary lab work. Additionally, gather more information about your maternal grandmother's age at diagnosis of colon cancer and your mother's colonoscopy history.

## 2023-05-28 ENCOUNTER — Ambulatory Visit (HOSPITAL_BASED_OUTPATIENT_CLINIC_OR_DEPARTMENT_OTHER)
Admission: RE | Admit: 2023-05-28 | Discharge: 2023-05-28 | Disposition: A | Discharge status: Home / Self Care | Payer: Self-pay | Source: Ambulatory Visit | Attending: Family Medicine | Admitting: Family Medicine

## 2023-05-28 DIAGNOSIS — E785 Hyperlipidemia, unspecified: Secondary | ICD-10-CM | POA: Insufficient documentation
